# Patient Record
Sex: Male | Born: 1955 | Race: White | Hispanic: No | Marital: Married | Smoking: Never smoker
Health system: Southern US, Community
[De-identification: ages and names within clinical notes are randomized; demographics above are authoritative.]

## PROBLEM LIST (undated history)

## (undated) DIAGNOSIS — IMO0001 Reserved for inherently not codable concepts without codable children: Secondary | ICD-10-CM

## (undated) DIAGNOSIS — I10 Essential (primary) hypertension: Secondary | ICD-10-CM

## (undated) DIAGNOSIS — K219 Gastro-esophageal reflux disease without esophagitis: Secondary | ICD-10-CM

---

## 2011-07-27 ENCOUNTER — Emergency Department (HOSPITAL_BASED_OUTPATIENT_CLINIC_OR_DEPARTMENT_OTHER)
Admission: EM | Admit: 2011-07-27 | Discharge: 2011-07-27 | Disposition: A | Discharge status: Home / Self Care | Payer: 59 | Attending: Emergency Medicine | Admitting: Emergency Medicine

## 2011-07-27 ENCOUNTER — Encounter (HOSPITAL_BASED_OUTPATIENT_CLINIC_OR_DEPARTMENT_OTHER): Payer: Self-pay | Admitting: *Deleted

## 2011-07-27 DIAGNOSIS — I10 Essential (primary) hypertension: Secondary | ICD-10-CM | POA: Insufficient documentation

## 2011-07-27 DIAGNOSIS — S51809A Unspecified open wound of unspecified forearm, initial encounter: Secondary | ICD-10-CM | POA: Insufficient documentation

## 2011-07-27 DIAGNOSIS — IMO0002 Reserved for concepts with insufficient information to code with codable children: Secondary | ICD-10-CM

## 2011-07-27 DIAGNOSIS — Z79899 Other long term (current) drug therapy: Secondary | ICD-10-CM | POA: Insufficient documentation

## 2011-07-27 DIAGNOSIS — M109 Gout, unspecified: Secondary | ICD-10-CM | POA: Insufficient documentation

## 2011-07-27 DIAGNOSIS — K219 Gastro-esophageal reflux disease without esophagitis: Secondary | ICD-10-CM | POA: Insufficient documentation

## 2011-07-27 DIAGNOSIS — W268XXA Contact with other sharp object(s), not elsewhere classified, initial encounter: Secondary | ICD-10-CM | POA: Insufficient documentation

## 2011-07-27 HISTORY — DX: Reserved for inherently not codable concepts without codable children: IMO0001

## 2011-07-27 HISTORY — DX: Gastro-esophageal reflux disease without esophagitis: K21.9

## 2011-07-27 HISTORY — DX: Essential (primary) hypertension: I10

## 2011-07-27 MED ORDER — LIDOCAINE-EPINEPHRINE 2 %-1:100000 IJ SOLN
INTRAMUSCULAR | Status: AC
  Filled 2011-07-27: qty 1

## 2011-07-27 NOTE — ED Provider Notes (Signed)
History    This chart was scribed for Hilario Quarry, MD, MD by Smitty Pluck. The patient was seen in room MH04 and the patient's care was started at 8:43PM.   CSN: [id number]  Arrival date & time 07/27/11  2009   First MD Initiated Contact with Patient 07/27/11 2033      Chief Complaint  Patient presents with  . Foreign Body    (Consider location/radiation/quality/duration/timing/severity/associated sxs/prior treatment) The history is provided by the patient.   Erik Wagner is a 56 y.o. male who presents to the Emergency Department complaining of foreign body (fish hook) in right forearm onset today while fishing. Pt reports tetanus is UTD. Denies any other pain. Pt has hx of gout and HTN.   Past Medical History  Diagnosis Date  . Hypertension   . Reflux   . Gout     History reviewed. No pertinent past surgical history.  History reviewed. No pertinent family history.  History  Substance Use Topics  . Smoking status: Never Smoker   . Smokeless tobacco: Not on file  . Alcohol Use: Yes      Review of Systems  All other systems reviewed and are negative.   10 Systems reviewed and all are negative for acute change except as noted in the HPI.   Allergies  Ivp dye  Home Medications   Current Outpatient Rx  Name Route Sig Dispense Refill  . ALLOPURINOL PO Oral Take 1 tablet by mouth daily.     Marland Kitchen AMLODIPINE BESYLATE PO Oral Take 1 tablet by mouth daily.     . CELECOXIB 50 MG PO CAPS Oral Take 50 mg by mouth daily.     Marland Kitchen PREVACID PO Oral Take 1 tablet by mouth daily.     Marland Kitchen LISINOPRIL 5 MG PO TABS Oral Take 5 mg by mouth daily.      There were no vitals taken for this visit.  Physical Exam  Nursing note and vitals reviewed. Constitutional: He is oriented to person, place, and time. He appears well-developed and well-nourished. No distress.  HENT:  Head: Normocephalic and atraumatic.  Eyes: EOM are normal. Pupils are equal, round, and reactive to light.    Neck: Normal range of motion. Neck supple. No tracheal deviation present.  Cardiovascular: Normal rate.   Pulmonary/Chest: Effort normal. No respiratory distress.  Abdominal: Soft. He exhibits no distension.  Musculoskeletal: Normal range of motion.  Neurological: He is alert and oriented to person, place, and time.  Skin: Skin is warm and dry.  Psychiatric: He has a normal mood and affect. His behavior is normal.    ED Course  FOREIGN BODY REMOVAL Date/Time: 07/27/2011 9:06 PM Performed by: Elson Areas Authorized by: Elson Areas Consent: Verbal consent obtained. Consent given by: patient Patient identity confirmed: verbally with patient Time out: Immediately prior to procedure a "time out" was called to verify the correct patient, procedure, equipment, support staff and site/side marked as required. Anesthesia: local infiltration Local anesthetic: lidocaine 2% with epinephrine Anesthetic total: 8 ml Complexity: simple 1 objects recovered. Objects recovered: fish hook Post-procedure assessment: foreign body removed Patient tolerance: Patient tolerated the procedure well with no immediate complications. Comments: Fishhook,  Pushed through and cut   (including critical care time) DIAGNOSTIC STUDIES:   COORDINATION OF CARE: 8:48PM EDP discusses pt ED treatment with pt    Labs Reviewed - No data to display No results found.   No diagnosis found.    MDM  Lonia Skinner [place], [place] 07/27/11 2108

## 2011-07-27 NOTE — Discharge Instructions (Signed)
Foreign Body  A foreign body is something in your body that should not be there. This may have been caused by a puncture wound or other injury. Puncture wounds become easily infected. This happens when bacteria (germs) get under the skin. Rusty nails and similar foreign bodies are often dirty and carry germs on them.   TREATMENT    A foreign body is usually removed if this can be easily done right after it happens.   Sometimes they are left in and removed at a later surgery. They may be left in indefinitely if they will not cause later problems.   The following are general instructions in caring for your wound.  HOME CARE INSTRUCTIONS    A dressing, depending on the location of the wound, may have been applied. This may be changed once per day or as instructed. If the dressing sticks, it may be soaked off with soapy water or hydrogen peroxide.   Only take over-the-counter or prescription medicines for pain, discomfort, or fever as directed by your caregiver.   Be aware that your body will work to remove the foreign substance. That is, the foreign body may work itself out of the wound. That is normal.   You may have received a recommendation to follow up with your physician or a specialist. It is very important to call for or keep follow-up appointments in order to avoid infection or other complications.  SEEK IMMEDIATE MEDICAL CARE IF:    There is redness, swelling, or increasing pain in the wound.   You notice a foul smell coming from the wound or dressing.   Pus is coming from the wound.   An unexplained oral temperature above 102 F (38.9 C) develops, or as your caregiver suggests.   There is increasing pain in the wound.  If you did not receive a tetanus shot today because you did not recall when your last one was given, check with your caregiver's office and determine if one is needed. Generally for a "dirty" wound, you should receive a tetanus booster if you have not had one in the last five  years. If you have a "clean" wound, you should receive a tetanus booster if you have not had one within the last ten years.  If you have a foreign body that needs removal and this was not done today, make sure you know how you are to follow up and what is the plan of action for taking care of this. It is your responsibility to follow up on this.  MAKE SURE YOU:    Understand these instructions.   Will watch your condition.   Will get help right away if you are not doing well or get worse.  Document Released: 10/02/2000 Document Revised: 03/28/2011 Document Reviewed: 11/26/2007  [employer] Patient Information 2012 [employer].

## 2011-07-27 NOTE — ED Notes (Signed)
Fish hook stuck in right forearm

## 2011-07-30 NOTE — ED Provider Notes (Signed)
  I performed a history and physical examination of Erik Wagner and discussed his management with Langston Masker  I agree with the history, physical, assessment, and plan of care, with the following exceptions: None  I was present for the following procedures: None Time Spent in Critical Care of the patient: None   Annisa Mazzarella Corlis Leak, MD 07/30/11 1734

## 2011-12-21 ENCOUNTER — Emergency Department (HOSPITAL_BASED_OUTPATIENT_CLINIC_OR_DEPARTMENT_OTHER)
Admission: EM | Admit: 2011-12-21 | Discharge: 2011-12-21 | Disposition: A | Discharge status: Home / Self Care | Payer: 59 | Attending: Emergency Medicine | Admitting: Emergency Medicine

## 2011-12-21 ENCOUNTER — Encounter (HOSPITAL_BASED_OUTPATIENT_CLINIC_OR_DEPARTMENT_OTHER): Payer: Self-pay | Admitting: *Deleted

## 2011-12-21 ENCOUNTER — Emergency Department (HOSPITAL_BASED_OUTPATIENT_CLINIC_OR_DEPARTMENT_OTHER): Payer: 59

## 2011-12-21 DIAGNOSIS — K219 Gastro-esophageal reflux disease without esophagitis: Secondary | ICD-10-CM | POA: Insufficient documentation

## 2011-12-21 DIAGNOSIS — I10 Essential (primary) hypertension: Secondary | ICD-10-CM | POA: Insufficient documentation

## 2011-12-21 DIAGNOSIS — X500XXA Overexertion from strenuous movement or load, initial encounter: Secondary | ICD-10-CM | POA: Insufficient documentation

## 2011-12-21 DIAGNOSIS — M109 Gout, unspecified: Secondary | ICD-10-CM | POA: Insufficient documentation

## 2011-12-21 DIAGNOSIS — S46219A Strain of muscle, fascia and tendon of other parts of biceps, unspecified arm, initial encounter: Secondary | ICD-10-CM

## 2011-12-21 DIAGNOSIS — S43499A Other sprain of unspecified shoulder joint, initial encounter: Secondary | ICD-10-CM | POA: Insufficient documentation

## 2011-12-21 MED ORDER — HYDROCODONE-ACETAMINOPHEN 5-500 MG PO TABS: 1.0000 | ORAL_TABLET | Freq: Four times a day (QID) | ORAL | Status: AC | PRN

## 2011-12-21 NOTE — ED Notes (Signed)
Discharged with one prescription 

## 2011-12-21 NOTE — ED Notes (Addendum)
Pt states he was lifting an object and felt pop in his left elbow. +radial pulse. Moves fingers. Feels touch, but cannot bend arm. Ice applied.

## 2011-12-22 NOTE — ED Provider Notes (Signed)
History     CSN: [id number]  Arrival date & time 12/21/11  1639   First MD Initiated Contact with Patient 12/21/11 1747      Chief Complaint  Patient presents with  . Elbow Injury    (Consider location/radiation/quality/duration/timing/severity/associated sxs/prior treatment) HPI Patient is a 56 yo male who presents today with acute onset of left elbow pain that began today when he was lowering a large piece of machinery off a trailer and felt a sudden popin his left antecubital fossa.  Subsequently patient has been unable to flex at the elbow and has obvious deformity in the curvature of his biceps.  Patient denies pain at rest and reports 3/10 pain with movement. He is on celebrex at baseline and has not taken anything for pain.  There are no other associated injuries. There are no other associated or modifying factors.  Past Medical History  Diagnosis Date  . Hypertension   . Reflux   . Gout     History reviewed. No pertinent past surgical history.  History reviewed. No pertinent family history.  History  Substance Use Topics  . Smoking status: Never Smoker   . Smokeless tobacco: Not on file  . Alcohol Use: Yes      Review of Systems  Constitutional: Negative.   HENT: Negative.   Eyes: Negative.   Respiratory: Negative.   Cardiovascular: Negative.   Gastrointestinal: Negative.   Genitourinary: Negative.   Musculoskeletal:       Left elbow pain  Skin: Negative.   Neurological: Negative.   Hematological: Negative.   Psychiatric/Behavioral: Negative.   All other systems reviewed and are negative.    Allergies  Ivp dye  Home Medications   Current Outpatient Rx  Name Route Sig Dispense Refill  . ALLOPURINOL 300 MG PO TABS Oral Take 300 mg by mouth daily.    Marland Kitchen AMLODIPINE BESYLATE 5 MG PO TABS Oral Take 5 mg by mouth daily.    . CELECOXIB 50 MG PO CAPS Oral Take 50 mg by mouth daily.     Marland Kitchen PREVACID PO Oral Take 1 tablet by mouth daily.     Marland Kitchen LISINOPRIL 5  MG PO TABS Oral Take 5 mg by mouth daily.    Marland Kitchen HYDROCODONE-ACETAMINOPHEN 5-500 MG PO TABS Oral Take 1-2 tablets by mouth every 6 (six) hours as needed for pain. 15 tablet 0    BP 130/80  Pulse 65  Temp 97.9 F (36.6 C) (Oral)  Resp 16  Ht 6' (1.829 m)  Wt 220 lb (99.791 kg)  BMI 29.84 kg/m2  SpO2 100%  Physical Exam  Nursing note and vitals reviewed. GEN: Well-developed, well-nourished male in no distress HEENT: Atraumatic, normocephalic.  EYES: PERRLA BL, no scleral icterus. NECK: Trachea midline, no meningismus PULM: No respiratory distress.   Neuro: cranial nerves grossly 2-12 intact, A and O x 3 WUJ:WJXB upper extremity with mild TTP just proximal to the [facility] fossa. Obvious deformity of left biceps with no ability to flex more than 15 degrees at the left elbow.  Otherwise WNL. Skin: No rashes petechiae, purpura, or jaundice Psych: no abnormality of mood   ED Course  Procedures (including critical care time)  Labs Reviewed - No data to display Dg Elbow Complete Left  12/21/2011  *RADIOLOGY REPORT*  Clinical Data: Left elbow injury and pain.  LEFT ELBOW - COMPLETE 3+ VIEW  Comparison: None.  Findings: No acute fracture or dislocation identified.  There is no evidence of joint effusion.  Some degenerative  changes are identified primarily at the level of the lateral epicondyle.  IMPRESSION: No acute findings.   Original Report Authenticated By: Reola Calkins, M.D.      1. Biceps tendon rupture, traumatic       MDM  Plain film showed no abnormalities and patient had presentation consistent with biceps tendon rupture.  He was given ice and a sling.  Patient already on celebrex.  Given Rx for narcotic pain relief in case needed but patient did not feel he needed this at this time.  Provided with ortho follow-up and discharged in good condition.        Cyndra Numbers, MD 12/22/11 1049

## 2013-08-24 IMAGING — CR DG ELBOW COMPLETE 3+V*L*
4 series · 4 of 4 positions shown · non-contrast
Comparison: None.

CLINICAL DATA: Left elbow injury and pain.

LEFT ELBOW - COMPLETE 3+ VIEW

[x elbow joint lat left]
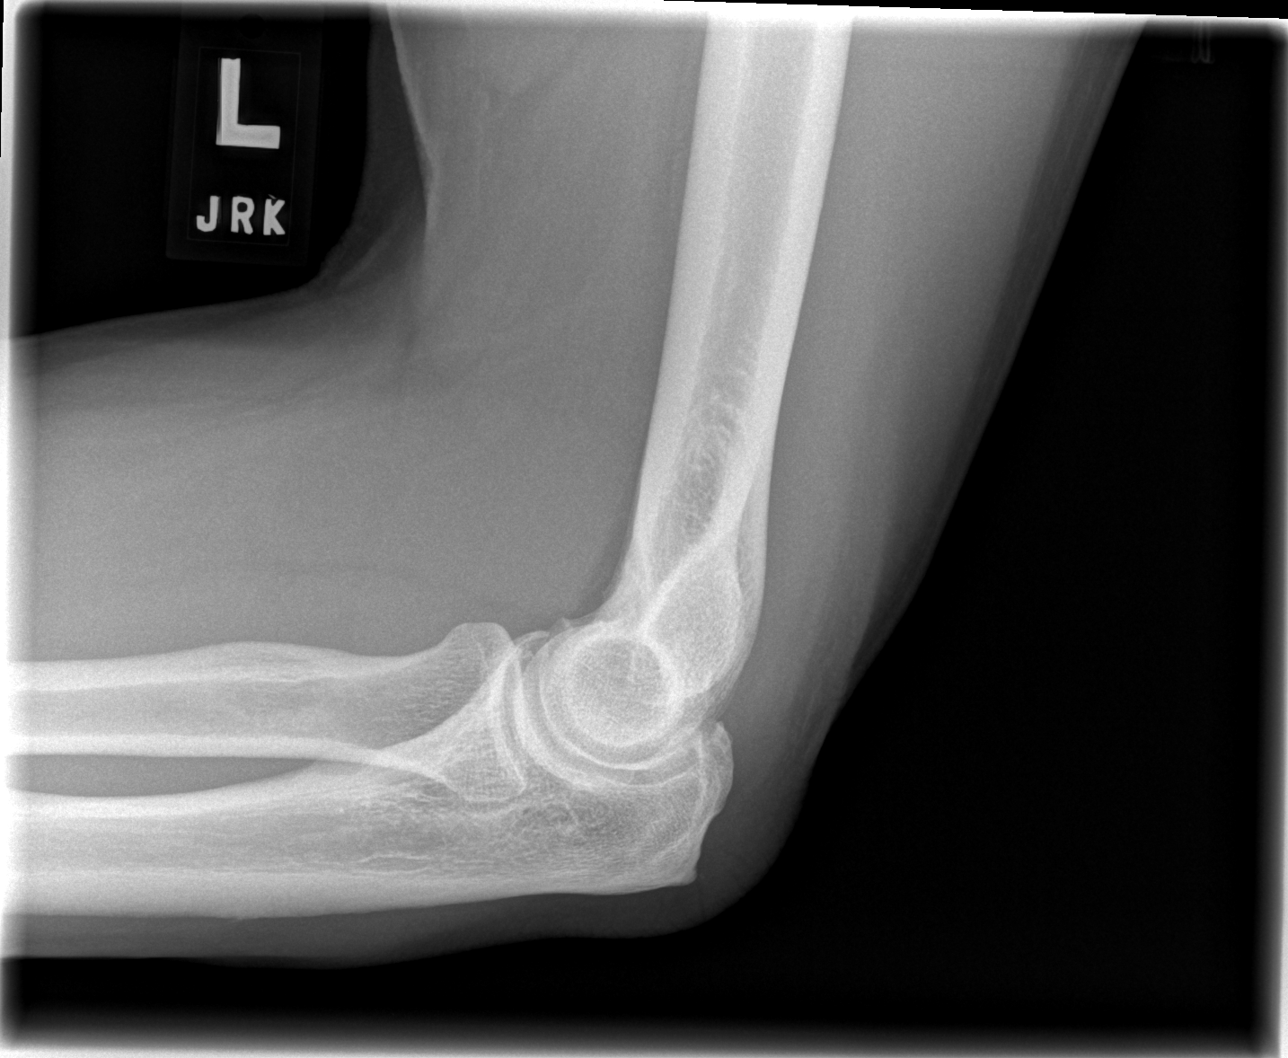

[x elbow joint ap left]
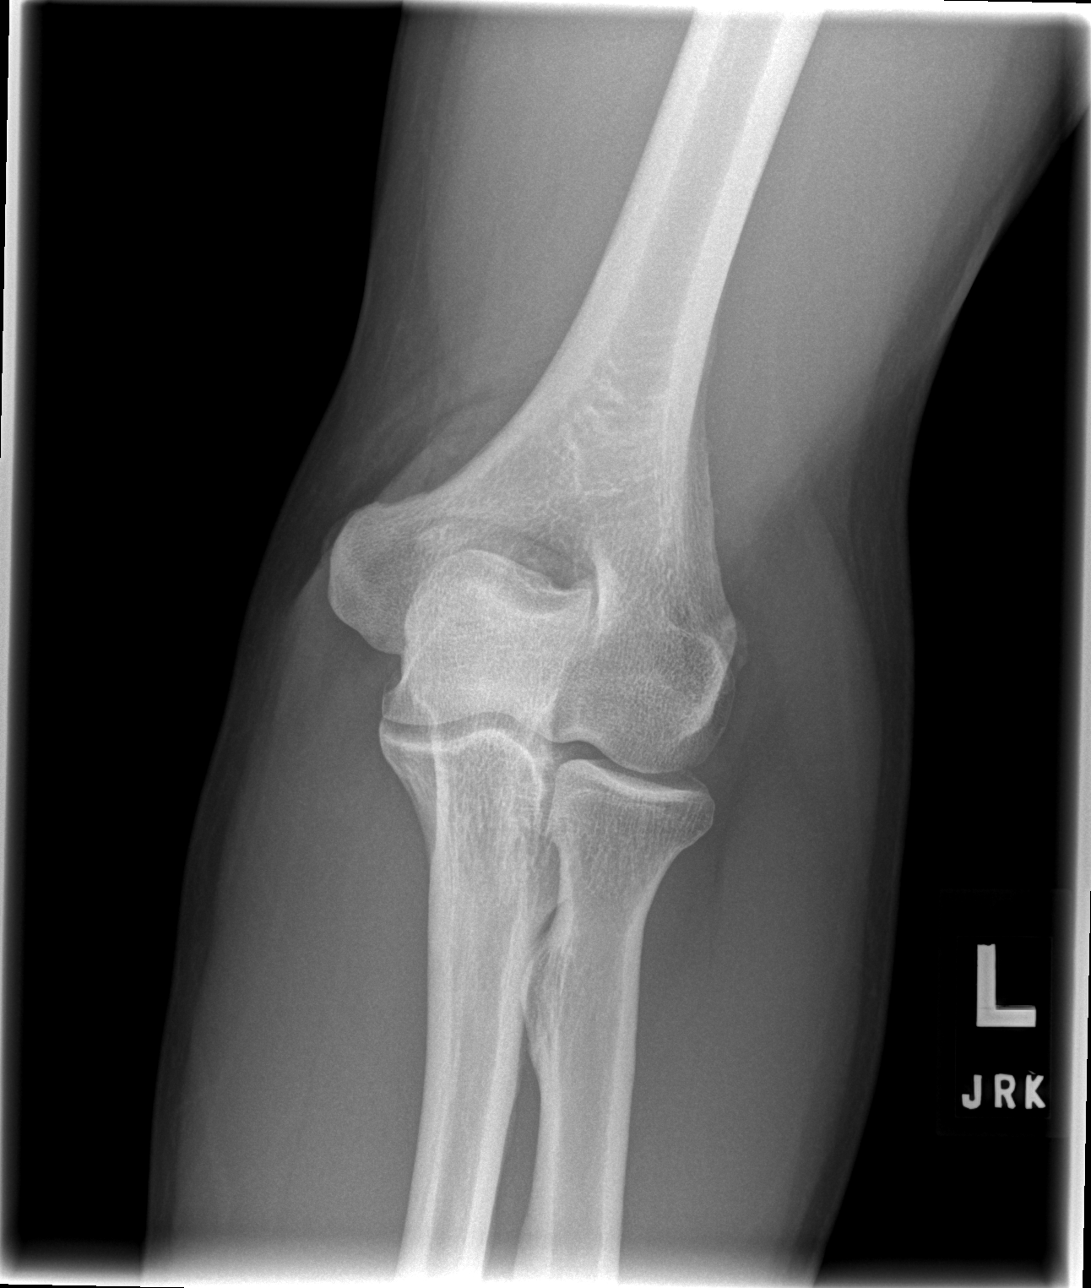

[x elbow joint obl. left (1 of 2)]
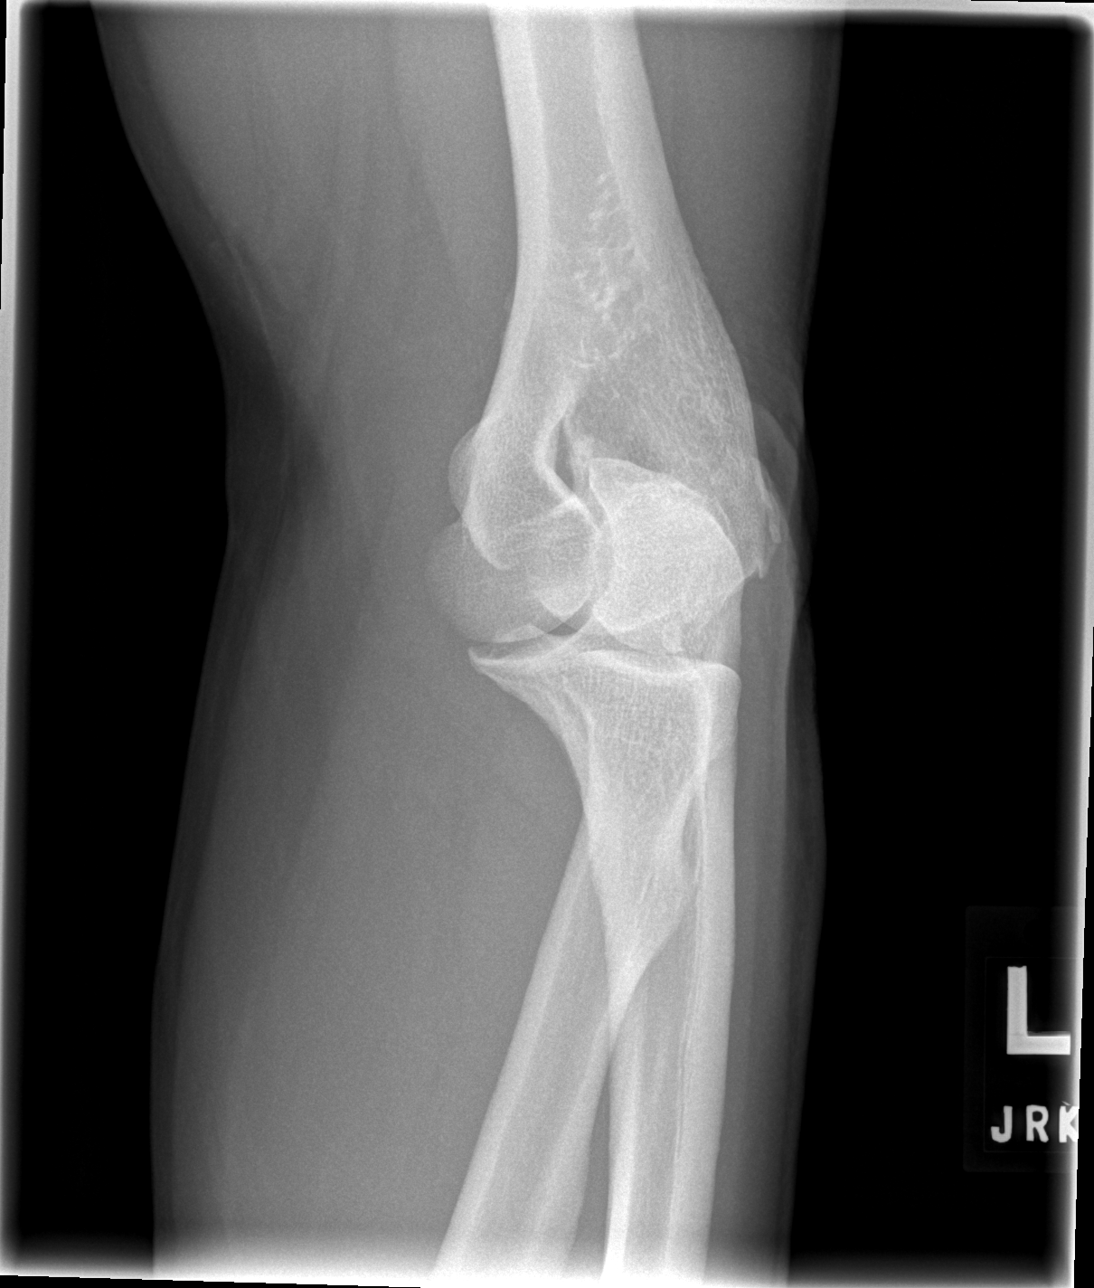

[x elbow joint obl. left (2 of 2)]
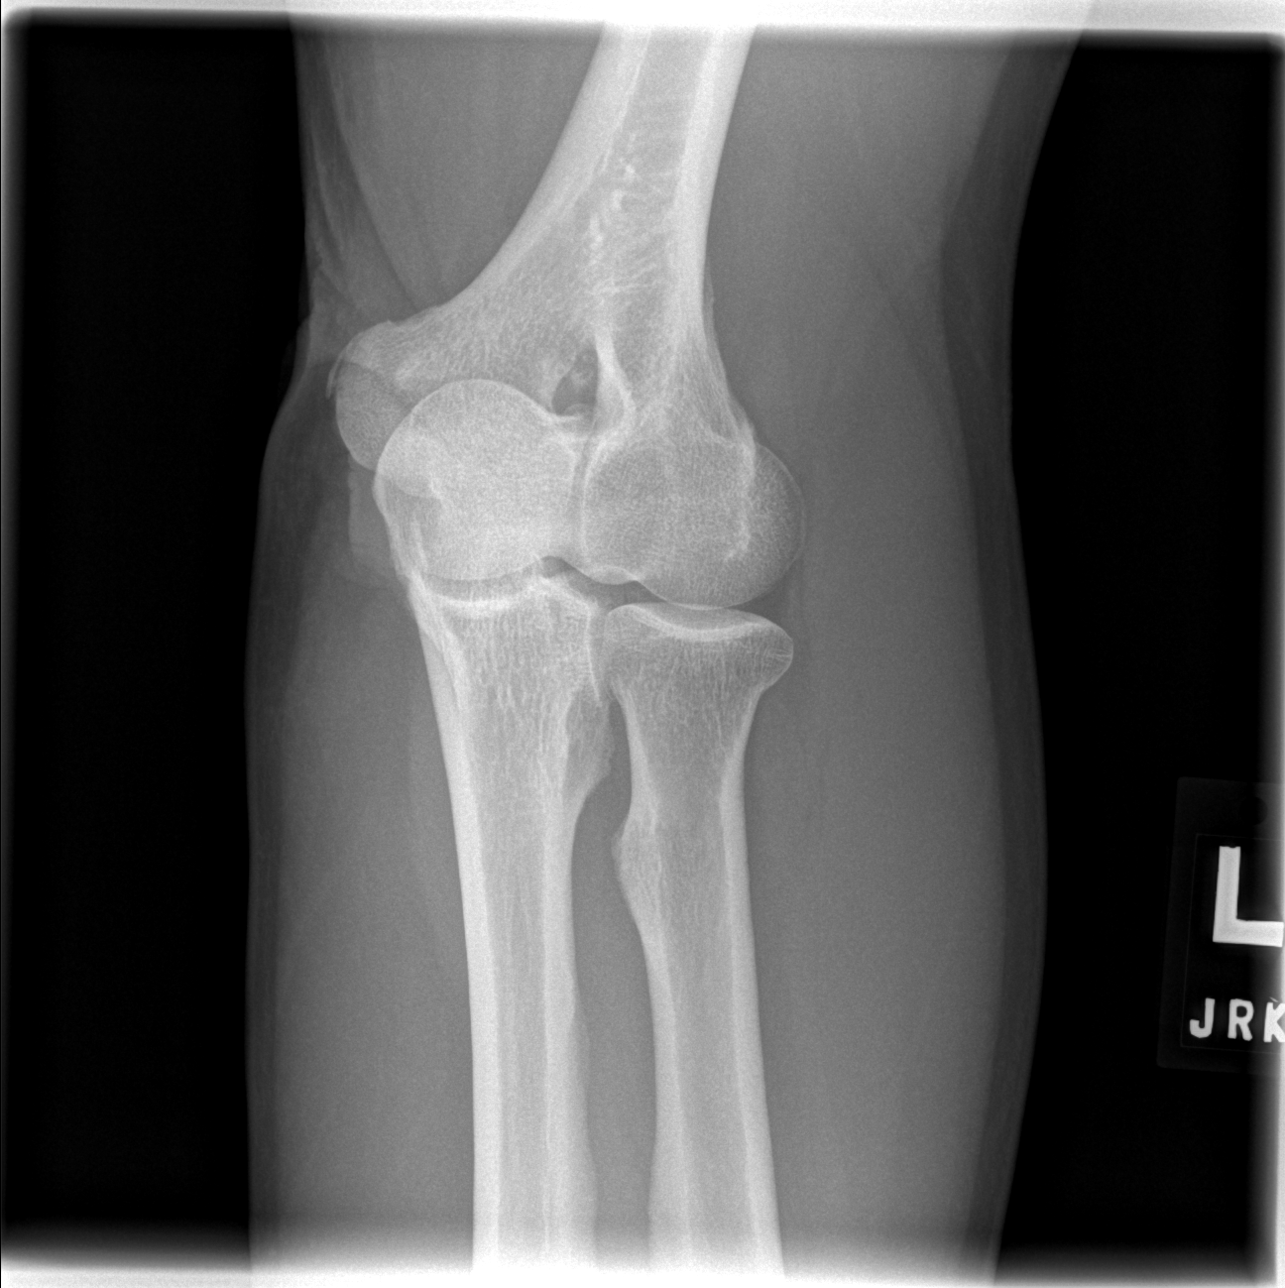

[4 of 4 positions shown; findings below may reference images not displayed]

FINDINGS: No acute fracture or dislocation identified.  There is no
evidence of joint effusion.  Some degenerative changes are
identified primarily at the level of the lateral epicondyle.
IMPRESSION: No acute findings.

## 2014-11-08 ENCOUNTER — Encounter (HOSPITAL_BASED_OUTPATIENT_CLINIC_OR_DEPARTMENT_OTHER): Payer: Self-pay

## 2014-11-08 ENCOUNTER — Emergency Department (HOSPITAL_BASED_OUTPATIENT_CLINIC_OR_DEPARTMENT_OTHER)
Admission: EM | Admit: 2014-11-08 | Discharge: 2014-11-08 | Disposition: A | Discharge status: Home / Self Care | Payer: BLUE CROSS/BLUE SHIELD | Attending: Emergency Medicine | Admitting: Emergency Medicine

## 2014-11-08 ENCOUNTER — Emergency Department (HOSPITAL_BASED_OUTPATIENT_CLINIC_OR_DEPARTMENT_OTHER): Payer: BLUE CROSS/BLUE SHIELD

## 2014-11-08 DIAGNOSIS — S91111A Laceration without foreign body of right great toe without damage to nail, initial encounter: Secondary | ICD-10-CM | POA: Insufficient documentation

## 2014-11-08 DIAGNOSIS — Z79899 Other long term (current) drug therapy: Secondary | ICD-10-CM | POA: Diagnosis not present

## 2014-11-08 DIAGNOSIS — S92424B Nondisplaced fracture of distal phalanx of right great toe, initial encounter for open fracture: Secondary | ICD-10-CM | POA: Insufficient documentation

## 2014-11-08 DIAGNOSIS — I10 Essential (primary) hypertension: Secondary | ICD-10-CM | POA: Insufficient documentation

## 2014-11-08 DIAGNOSIS — W228XXA Striking against or struck by other objects, initial encounter: Secondary | ICD-10-CM | POA: Diagnosis not present

## 2014-11-08 DIAGNOSIS — S92404B Nondisplaced unspecified fracture of right great toe, initial encounter for open fracture: Secondary | ICD-10-CM

## 2014-11-08 DIAGNOSIS — M109 Gout, unspecified: Secondary | ICD-10-CM | POA: Diagnosis not present

## 2014-11-08 DIAGNOSIS — Y9289 Other specified places as the place of occurrence of the external cause: Secondary | ICD-10-CM | POA: Diagnosis not present

## 2014-11-08 DIAGNOSIS — K219 Gastro-esophageal reflux disease without esophagitis: Secondary | ICD-10-CM | POA: Insufficient documentation

## 2014-11-08 DIAGNOSIS — Y9301 Activity, walking, marching and hiking: Secondary | ICD-10-CM | POA: Diagnosis not present

## 2014-11-08 DIAGNOSIS — Z791 Long term (current) use of non-steroidal anti-inflammatories (NSAID): Secondary | ICD-10-CM | POA: Insufficient documentation

## 2014-11-08 DIAGNOSIS — R42 Dizziness and giddiness: Secondary | ICD-10-CM | POA: Diagnosis not present

## 2014-11-08 DIAGNOSIS — Y998 Other external cause status: Secondary | ICD-10-CM | POA: Insufficient documentation

## 2014-11-08 DIAGNOSIS — S99921A Unspecified injury of right foot, initial encounter: Secondary | ICD-10-CM | POA: Diagnosis present

## 2014-11-08 LAB — CBC
HCT: 42.4 % (ref 39.0–52.0)
Hemoglobin: 14.6 g/dL (ref 13.0–17.0)
MCH: 32.2 pg (ref 26.0–34.0)
MCHC: 34.4 g/dL (ref 30.0–36.0)
MCV: 93.6 fL (ref 78.0–100.0)
Platelets: 186 10*3/uL (ref 150–400)
RBC: 4.53 MIL/uL (ref 4.22–5.81)
RDW: 12.9 % (ref 11.5–15.5)
WBC: 6.9 10*3/uL (ref 4.0–10.5)

## 2014-11-08 LAB — BASIC METABOLIC PANEL
ANION GAP: 6 (ref 5–15)
BUN: 21 mg/dL — AB (ref 6–20)
CHLORIDE: 105 mmol/L (ref 101–111)
CO2: 26 mmol/L (ref 22–32)
CREATININE: 1.14 mg/dL (ref 0.61–1.24)
Calcium: 8.6 mg/dL — ABNORMAL LOW (ref 8.9–10.3)
GFR calc Af Amer: >60 mL/min (ref >60)
Glucose, Bld: 92 mg/dL (ref 65–99)
Potassium: 3.8 mmol/L (ref 3.5–5.1)
Sodium: 137 mmol/L (ref 135–145)

## 2014-11-08 MED ORDER — CEFAZOLIN SODIUM 1-5 GM-% IV SOLN
INTRAVENOUS | Status: AC
  Administered 2014-11-08: 1000 mg
  Filled 2014-11-08: qty 100

## 2014-11-08 MED ORDER — DEXTROSE 5 % IV SOLN: 2.0000 g | Freq: Once | INTRAVENOUS | Status: DC

## 2014-11-08 MED ORDER — CEPHALEXIN 500 MG PO CAPS: 500.0000 mg | ORAL_CAPSULE | Freq: Four times a day (QID) | ORAL | Status: AC

## 2014-11-08 MED ORDER — SODIUM CHLORIDE 0.9 % IV BOLUS (SEPSIS)
1000.0000 mL | Freq: Once | INTRAVENOUS | Status: AC
  Administered 2014-11-08: 1000 mL via INTRAVENOUS

## 2014-11-08 MED ORDER — LIDOCAINE HCL (PF) 1 % IJ SOLN
INTRAMUSCULAR | Status: AC
  Administered 2014-11-08: 10 mL
  Filled 2014-11-08: qty 5

## 2014-11-08 MED ORDER — MORPHINE SULFATE 4 MG/ML IJ SOLN
4.0000 mg | Freq: Once | INTRAMUSCULAR | Status: AC
  Administered 2014-11-08: 4 mg via INTRAVENOUS
  Filled 2014-11-08: qty 1

## 2014-11-08 MED ORDER — OXYCODONE-ACETAMINOPHEN 5-325 MG PO TABS: 1.0000 | ORAL_TABLET | Freq: Four times a day (QID) | ORAL | Status: AC | PRN

## 2014-11-08 MED ORDER — HYDROMORPHONE HCL 1 MG/ML IJ SOLN
1.0000 mg | Freq: Once | INTRAMUSCULAR | Status: AC
  Administered 2014-11-08: 1 mg via INTRAVENOUS
  Filled 2014-11-08: qty 1

## 2014-11-08 MED ORDER — LIDOCAINE HCL (PF) 1 % IJ SOLN
10.0000 mL | Freq: Once | INTRAMUSCULAR | Status: AC
  Administered 2014-11-08: 10 mL
  Filled 2014-11-08: qty 10

## 2014-11-08 MED ORDER — CEFAZOLIN SODIUM 1-5 GM-% IV SOLN
1.0000 g | Freq: Once | INTRAVENOUS | Status: AC
  Administered 2014-11-08: 1 g via INTRAVENOUS

## 2014-11-08 NOTE — Discharge Instructions (Signed)
Take percocet for severe pain only. No driving or operating heavy machinery while taking percocet. This medication may cause drowsiness. Take keflex as directed.  Toe Fracture Your caregiver has diagnosed you as having a fractured toe. A toe fracture is a break in the bone of a toe. "Buddy taping" is a way of splinting your broken toe, by taping the broken toe to the toe next to it. This "buddy taping" will keep the injured toe from moving beyond normal range of motion. Buddy taping also helps the toe heal in a more normal alignment. It may take 6 to 8 weeks for the toe injury to heal. HOME CARE INSTRUCTIONS   Leave your toes taped together for as long as directed by your caregiver or until you see a doctor for a follow-up examination. You can change the tape after bathing. Always use a small piece of gauze or cotton between the toes when taping them together. This will help the skin stay dry and prevent infection.  Apply ice to the injury for 15-20 minutes each hour while awake for the first 2 days. Put the ice in a plastic bag and place a towel between the bag of ice and your skin.  After the first 2 days, apply heat to the injured area. Use heat for the next 2 to 3 days. Place a heating pad on the foot or soak the foot in warm water as directed by your caregiver.  Keep your foot elevated as much as possible to lessen swelling.  Wear sturdy, supportive shoes. The shoes should not pinch the toes or fit tightly against the toes.  Your caregiver may prescribe a rigid shoe if your foot is very swollen.  Your may be given crutches if the pain is too great and it hurts too much to walk.  Only take over-the-counter or prescription medicines for pain, discomfort, or fever as directed by your caregiver.  If your caregiver has given you a follow-up appointment, it is very important to keep that appointment. Not keeping the appointment could result in a chronic or permanent injury, pain, and  disability. If there is any problem keeping the appointment, you must call back to this facility for assistance. SEEK MEDICAL CARE IF:   You have increased pain or swelling, not relieved with medications.  The pain does not get better after 1 week.  Your injured toe is cold when the others are warm. SEEK IMMEDIATE MEDICAL CARE IF:   The toe becomes cold, numb, or white.  The toe becomes hot (inflamed) and red. Document Released: 04/05/2000 Document Revised: 07/01/2011 Document Reviewed: 11/23/2007 [facility] Patient Information 2015 [place], [place]. This information is not intended to replace advice given to you by your health care provider. Make sure you discuss any questions you have with your health care provider. Laceration Care, Adult A laceration is a cut or lesion that goes through all layers of the skin and into the tissue just beneath the skin. TREATMENT  Some lacerations may not require closure. Some lacerations may not be able to be closed due to an increased risk of infection. It is important to see your caregiver as soon as possible after an injury to minimize the risk of infection and maximize the opportunity for successful closure. If closure is appropriate, pain medicines may be given, if needed. The wound will be cleaned to help prevent infection. Your caregiver will use stitches (sutures), staples, wound glue (adhesive), or skin adhesive strips to repair the laceration. These tools bring  the skin edges together to allow for faster healing and a better cosmetic outcome. However, all wounds will heal with a scar. Once the wound has healed, scarring can be minimized by covering the wound with sunscreen during the day for 1 full year. HOME CARE INSTRUCTIONS  For sutures or staples:  Keep the wound clean and dry.  If you were given a bandage (dressing), you should change it at least once a day. Also, change the dressing if it becomes wet or dirty, or as directed by your  caregiver.  Wash the wound with soap and water 2 times a day. Rinse the wound off with water to remove all soap. Pat the wound dry with a clean towel.  After cleaning, apply a thin layer of the antibiotic ointment as recommended by your caregiver. This will help prevent infection and keep the dressing from sticking.  You may shower as usual after the first 24 hours. Do not soak the wound in water until the sutures are removed.  Only take over-the-counter or prescription medicines for pain, discomfort, or fever as directed by your caregiver.  Get your sutures or staples removed as directed by your caregiver. For skin adhesive strips:  Keep the wound clean and dry.  Do not get the skin adhesive strips wet. You may bathe carefully, using caution to keep the wound dry.  If the wound gets wet, pat it dry with a clean towel.  Skin adhesive strips will fall off on their own. You may trim the strips as the wound heals. Do not remove skin adhesive strips that are still stuck to the wound. They will fall off in time. For wound adhesive:  You may briefly wet your wound in the shower or bath. Do not soak or scrub the wound. Do not swim. Avoid periods of heavy perspiration until the skin adhesive has fallen off on its own. After showering or bathing, gently pat the wound dry with a clean towel.  Do not apply liquid medicine, cream medicine, or ointment medicine to your wound while the skin adhesive is in place. This may loosen the film before your wound is healed.  If a dressing is placed over the wound, be careful not to apply tape directly over the skin adhesive. This may cause the adhesive to be pulled off before the wound is healed.  Avoid prolonged exposure to sunlight or tanning lamps while the skin adhesive is in place. Exposure to ultraviolet light in the first year will darken the scar.  The skin adhesive will usually remain in place for 5 to 10 days, then naturally fall off the skin. Do  not pick at the adhesive film. You may need a tetanus shot if:  You cannot remember when you had your last tetanus shot.  You have never had a tetanus shot. If you get a tetanus shot, your arm may swell, get red, and feel warm to the touch. This is common and not a problem. If you need a tetanus shot and you choose not to have one, there is a rare chance of getting tetanus. Sickness from tetanus can be serious. SEEK MEDICAL CARE IF:   You have redness, swelling, or increasing pain in the wound.  You see a red line that goes away from the wound.  You have yellowish-white fluid (pus) coming from the wound.  You have a fever.  You notice a bad smell coming from the wound or dressing.  Your wound breaks open before or after sutures  have been removed.  You notice something coming out of the wound such as wood or glass.  Your wound is on your hand or foot and you cannot move a finger or toe. SEEK IMMEDIATE MEDICAL CARE IF:   Your pain is not controlled with prescribed medicine.  You have severe swelling around the wound causing pain and numbness or a change in color in your arm, hand, leg, or foot.  Your wound splits open and starts bleeding.  You have worsening numbness, weakness, or loss of function of any joint around or beyond the wound.  You develop painful lumps near the wound or on the skin anywhere on your body. MAKE SURE YOU:   Understand these instructions.  Will watch your condition.  Will get help right away if you are not doing well or get worse. Document Released: 04/08/2005 Document Revised: 07/01/2011 Document Reviewed: 10/02/2010 [facility] Patient Information 2015 [place]. This information is not intended to replace advice given to you by your health care provider. Make sure you discuss any questions you have with your health care provider.

## 2014-11-08 NOTE — ED Notes (Signed)
Pt states his td is utd.

## 2014-11-08 NOTE — ED Notes (Signed)
Patient transported to X-ray 

## 2014-11-08 NOTE — ED Notes (Signed)
Iv d/c tip intact, dsd applied. Iv started earlier by misty, rn.

## 2014-11-08 NOTE — ED Provider Notes (Signed)
CSN: [id number]     Arrival date & time 11/08/14  1558 History   First MD Initiated Contact with Patient 11/08/14 1600     Chief Complaint  Patient presents with  . Foot Injury     (Consider location/radiation/quality/duration/timing/severity/associated sxs/prior Treatment) HPI Comments: 59 year old male presenting with right great toe pain and laceration occurring just prior to arrival. Patient states she was walking up the steps in flip flops and his toe got stuck, and when he tried to move, his "big toe did not go with him". He felt something tear and immediately had severe pain rated 7/10. He cannot move his toe. States there was a large amount of bleeding and is starting to feel lightheaded. Last tetanus 2012.  Patient is a 59 y.o. male presenting with foot injury. The history is provided by the patient.  Foot Injury   Past Medical History  Diagnosis Date  . Hypertension   . Reflux   . Gout    History reviewed. No pertinent past surgical history. No family history on file. History  Substance Use Topics  . Smoking status: Never Smoker   . Smokeless tobacco: Not on file  . Alcohol Use: Yes    Review of Systems  Skin: Positive for wound.  Neurological: Positive for light-headedness.  All other systems reviewed and are negative.     Allergies  Ivp dye  Home Medications   Prior to Admission medications   Medication Sig Start Date End Date Taking? Authorizing Provider  allopurinol (ZYLOPRIM) 300 MG tablet Take 300 mg by mouth daily.    Historical Provider, MD  amLODipine (NORVASC) 5 MG tablet Take 5 mg by mouth daily.    Historical Provider, MD  celecoxib (CELEBREX) 50 MG capsule Take 50 mg by mouth daily.     Historical Provider, MD  cephALEXin (KEFLEX) 500 MG capsule Take 1 capsule (500 mg total) by mouth 4 (four) times daily. 11/08/14   Affan Callow M Markise Haymer, PA-C  Lansoprazole (PREVACID PO) Take 1 tablet by mouth daily.     Historical Provider, MD  lisinopril  (PRINIVIL,ZESTRIL) 5 MG tablet Take 5 mg by mouth daily.    Historical Provider, MD  oxyCODONE-acetaminophen (PERCOCET) 5-325 MG per tablet Take 1-2 tablets by mouth every 6 (six) hours as needed for severe pain. 11/08/14   Desi Rowe M Arieona Swaggerty, PA-C   BP 142/86 mmHg  Pulse 69  Temp(Src) 98 F (36.7 C) (Oral)  Resp 16  Ht  (1.905 m)  Wt 225 lb (102.059 kg)  BMI 28.12 kg/m2  SpO2 98% Physical Exam  Constitutional: He is oriented to person, place, and time. He appears well-developed and well-nourished. No distress.  HENT:  Head: Normocephalic and atraumatic.  Eyes: Conjunctivae and EOM are normal.  Neck: Normal range of motion. Neck supple.  Cardiovascular: Normal rate, regular rhythm and normal heart sounds.   Pulmonary/Chest: Effort normal and breath sounds normal.  Musculoskeletal:  R big toe with large laceration through underlying tissue across dorsal aspect. Unable to flex his toe. Actively bleeding. See image below.  Neurological: He is alert and oriented to person, place, and time.  Skin: Skin is warm and dry.  Psychiatric: He has a normal mood and affect. His behavior is normal.  Nursing note and vitals reviewed.     ED Course  NERVE BLOCK Date/Time: 11/08/2014 6:18 PM Performed by: Kathrynn Speed Authorized by: Kathrynn Speed Consent: Verbal consent obtained. Risks and benefits: risks, benefits and alternatives were discussed Consent given by:  patient Patient understanding: patient states understanding of the procedure being performed Patient identity confirmed: arm band and verbally with patient Indications: pain relief, extensive wound and fracture Body area: lower extremity Nerve: digital Laterality: right Patient sedated: no Needle gauge: 25 G Location technique: anatomical landmarks Local anesthetic: lidocaine 1% without epinephrine Anesthetic total: 3 ml Outcome: pain improved Patient tolerance: Patient tolerated the procedure well with no immediate  complications   (including critical care time) LACERATION REPAIR Performed by: Celene Skeenobyn Merril Nagy Authorized by: Celene Skeenobyn Eliya Bubar Consent: Verbal consent obtained. Risks and benefits: risks, benefits and alternatives were discussed Consent given by: patient Patient identity confirmed: provided demographic data Prepped and Draped in normal sterile fashion Wound explored  Laceration Location: right big toe  Laceration Length: 3 cm  No Foreign Bodies seen or palpated  Anesthesia: local infiltration, digital block  Local anesthetic: lidocaine 1% without epinephrine  Anesthetic total: 5 ml  Irrigation method: syringe Amount of cleaning: standard  Skin closure: 4-0 chromic gut, 4-0 prolene  Number of sutures: 2 chromic gut subQ, 12 prolene  Technique: simple interrupted  Patient tolerance: Patient tolerated the procedure well with no immediate complications.  Labs Review Labs Reviewed  BASIC METABOLIC PANEL - Abnormal; Notable for the following:    BUN 21 (*)    Calcium 8.6 (*)    All other components within normal limits  CBC    Imaging Review Dg Foot Complete Right  11/08/2014   CLINICAL DATA:  Caught foot wall walking up steps to the deck, laceration great toe  EXAM: RIGHT FOOT COMPLETE - 3+ VIEW  COMPARISON:  None  FINDINGS: Dressing artifacts at great toe.  Osseous mineralization normal.  Joint spaces preserved.  Comminuted nondisplaced fracture distal phalanx RIGHT great toe with intra-articular extension at the lateral aspect of the IP joint.  No additional fracture, dislocation or bone destruction.  Calcaneal spurring.  IMPRESSION: Comminuted nondisplaced fracture at distal phalanx great toe with intra-articular extension at the IP joint.   Electronically Signed   By: Ulyses SouthwardMark  Boles M.D.   On: 11/08/2014 16:58     EKG Interpretation None      MDM   Final diagnoses:  Open nondisplaced fracture of right great toe, initial encounter   Wound care given. Likely has tendon  injury given he cannot flex his toe. Laceration caused by his toe extending back, did not get cut on anything. Xray results as stated above. I spoke with Dr. Despina HickAlusio, orthopedist on call who suggests suturing the wound in the ED and having him f/u in 2 days in his office. Dr. Despina HickAlusio looked at both the xray and the picture in chart. Dose of IV ancef given in ED. D/c home on keflex and with percocet. Post op shoe applied, crutches given. Stable for d/c. Return precautions given. Patient states understanding of treatment care plan and is agreeable.  Discussed with attending Dr. Rubin PayorPickering who also evaluated patient and agrees with plan of care.  Kathrynn SpeedRobyn M Albirda Shiel, PA-C 11/08/14 Rickey [id number]  Benjiman CoreNathan Pickering, MD 11/08/14 [phone]

## 2014-11-08 NOTE — ED Notes (Signed)
Active bleeding noted upon removal of dressing for wound cleansing, pressure dressing applied. Pt tolerated well.

## 2014-11-08 NOTE — ED Notes (Signed)
Pts foot got caught while walking up steps. Laceration to right foot. Wrapped in triage

## 2016-07-12 IMAGING — CR DG FOOT COMPLETE 3+V*R*
3 series · 3 of 3 positions shown · non-contrast
Comparison: None

CLINICAL DATA: Caught foot wall walking up steps to the deck,
laceration great toe

EXAM:
RIGHT FOOT COMPLETE - 3+ VIEW

[t foot lat right]
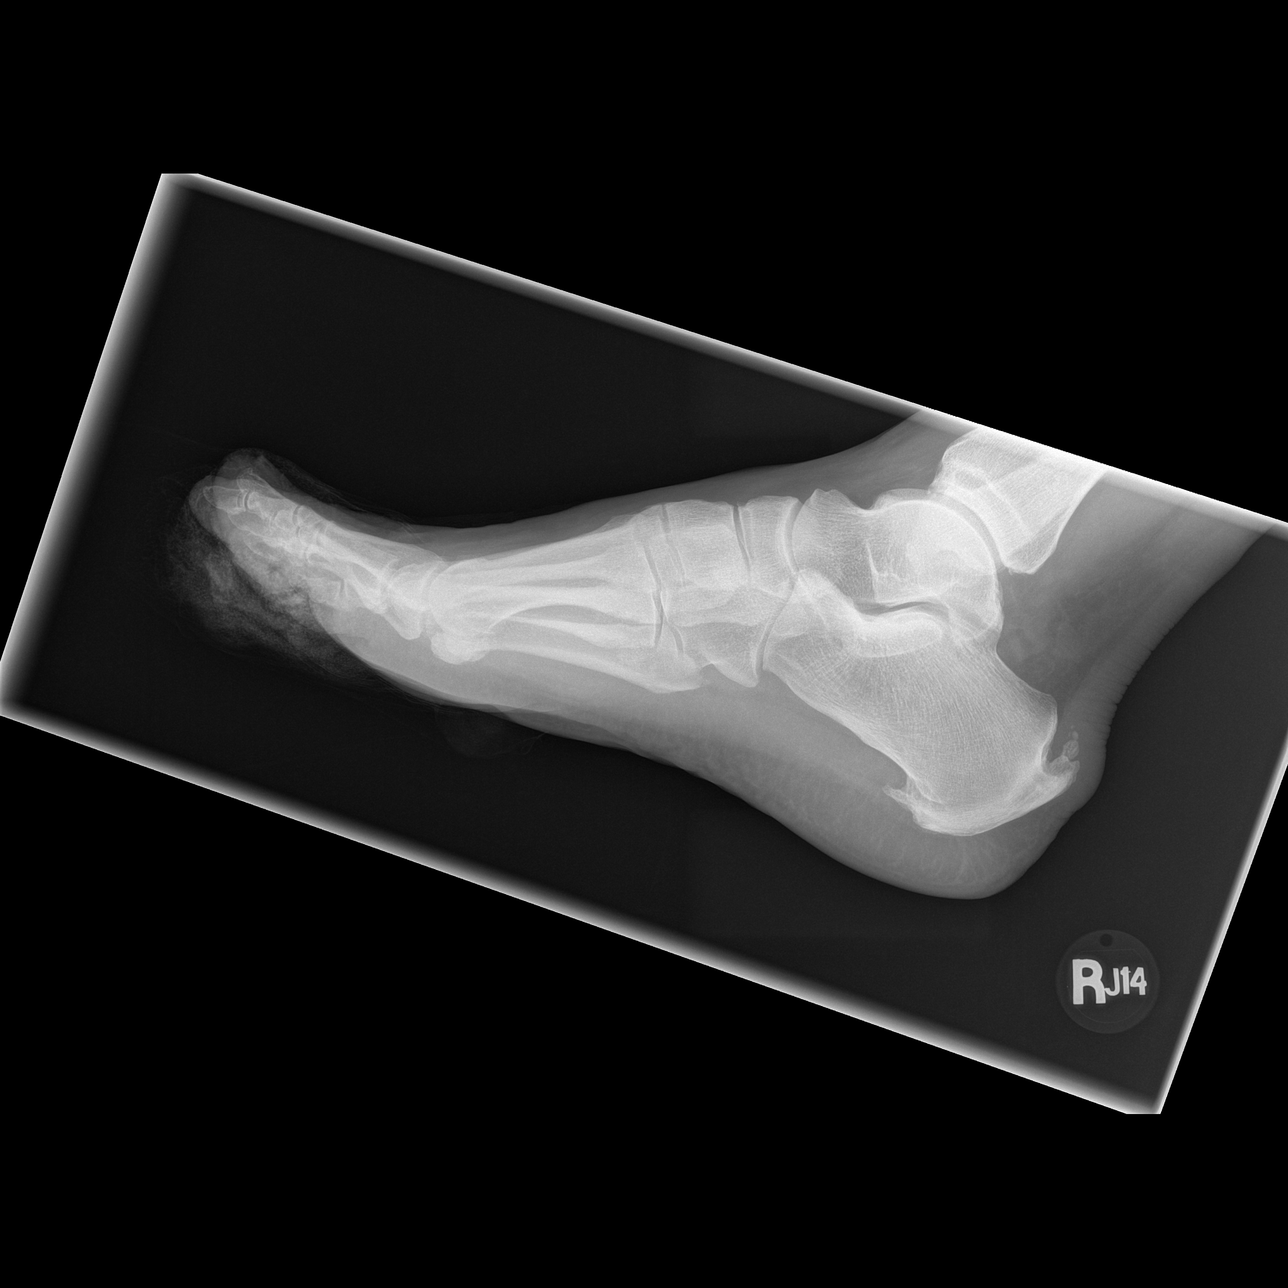

[t foot ap right]
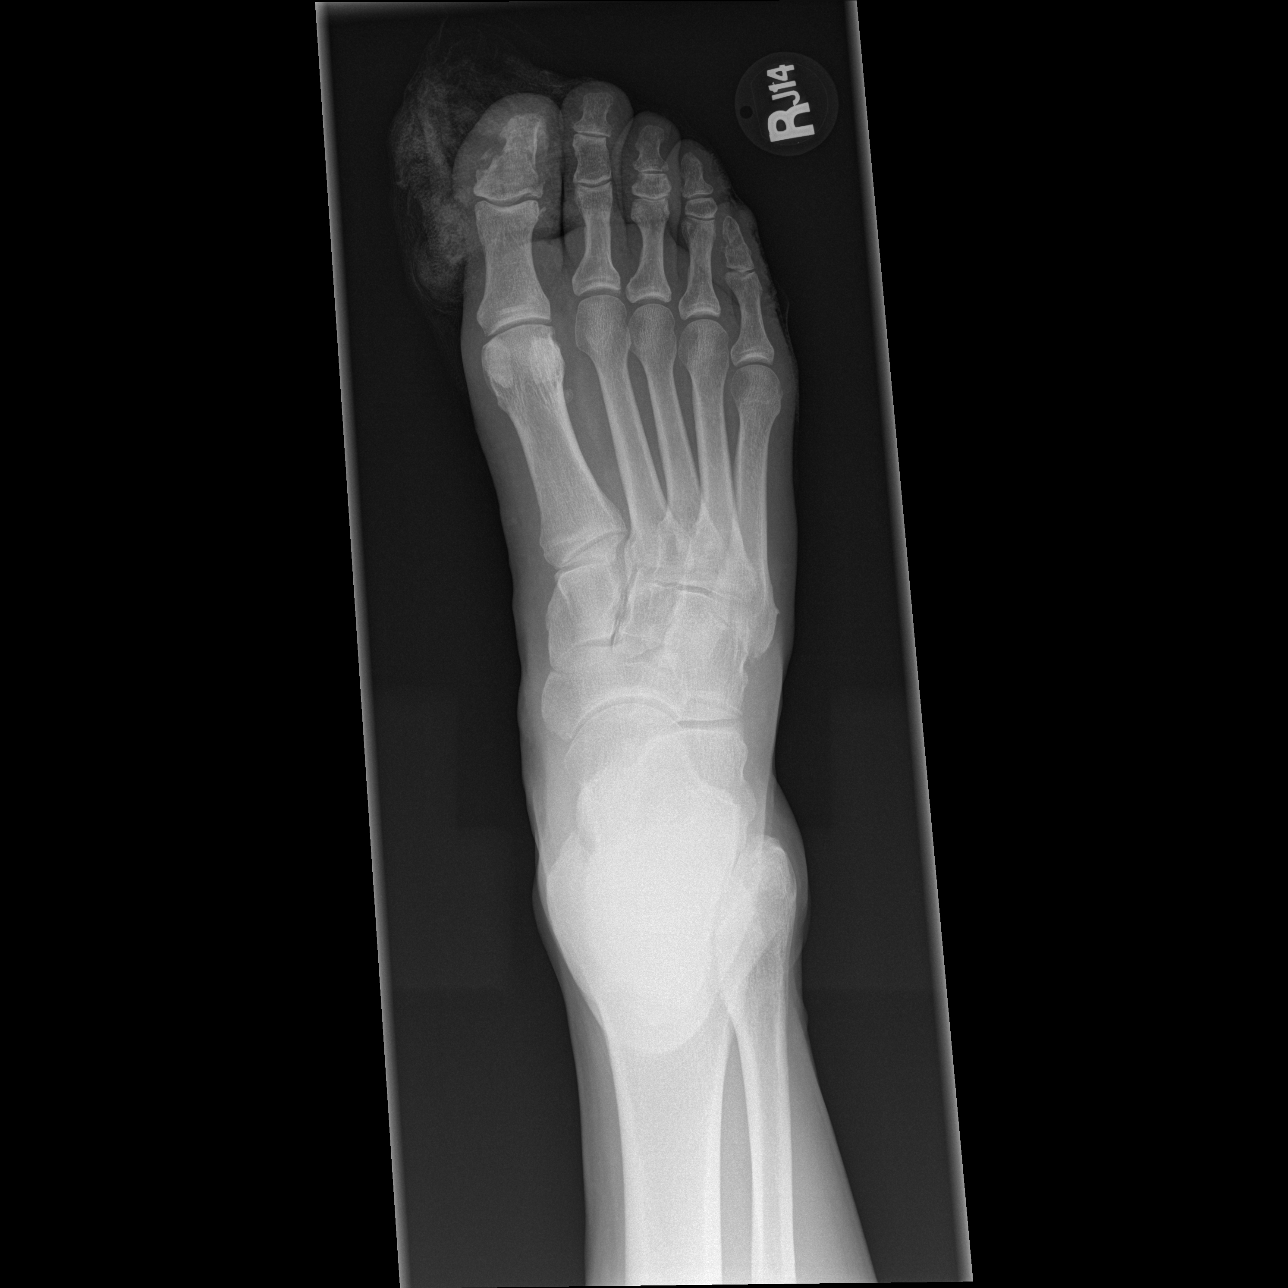

[t foot oblique right]
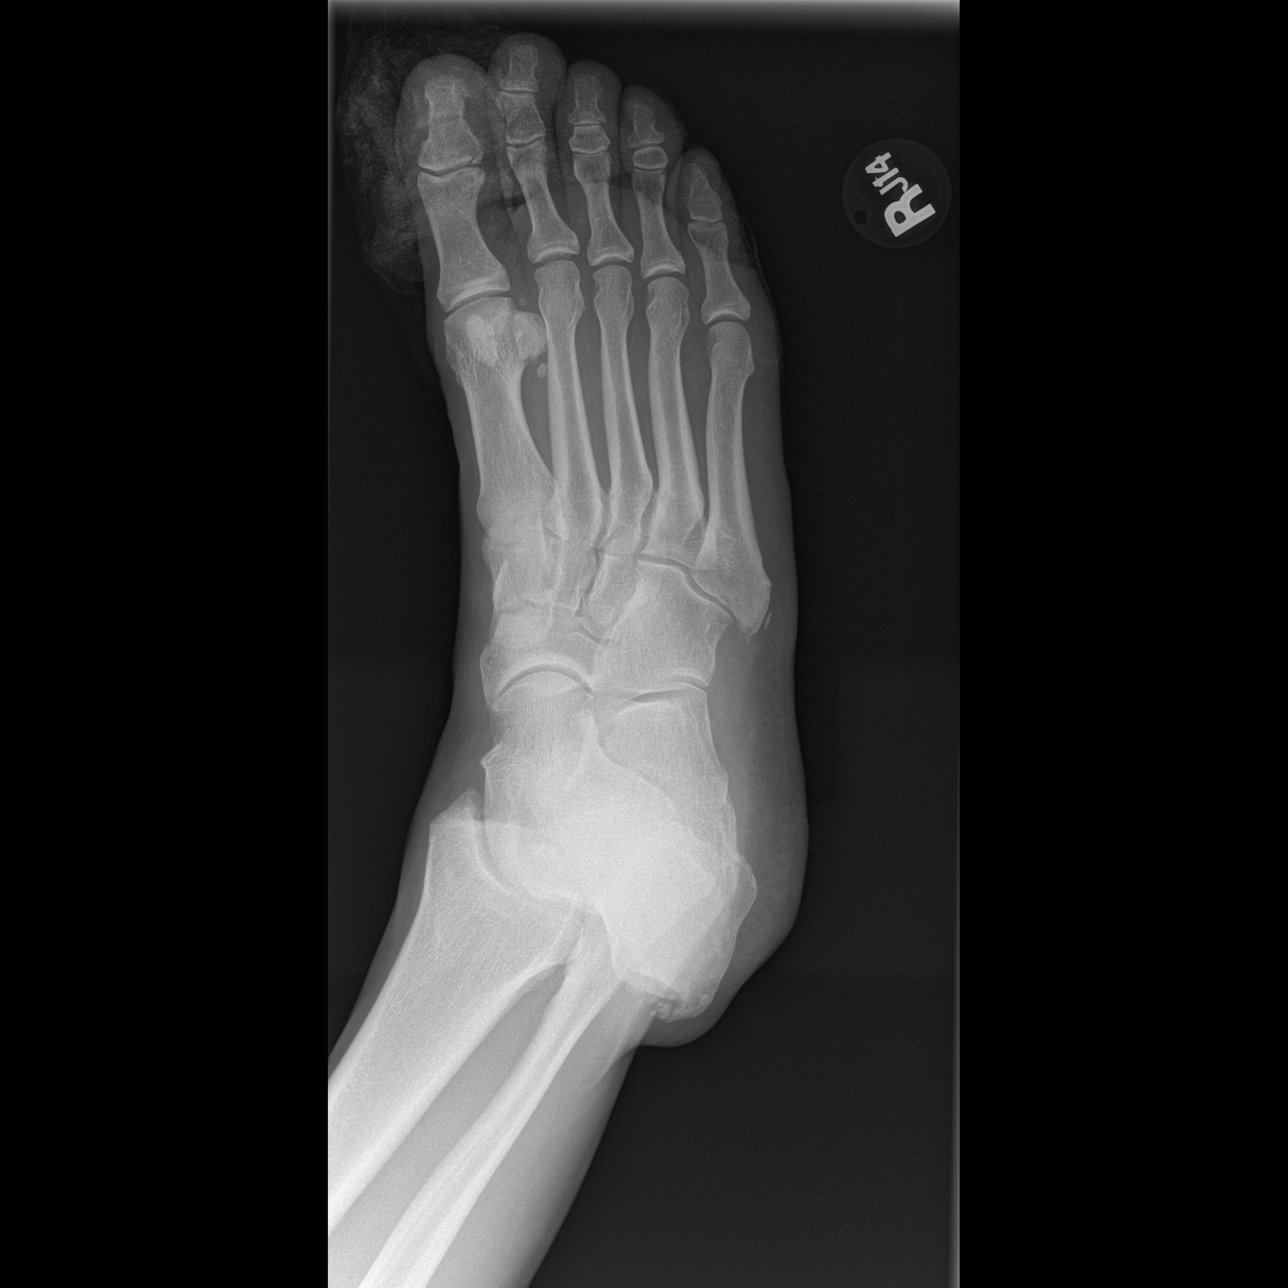

[3 of 3 positions shown; findings below may reference images not displayed]

FINDINGS: Dressing artifacts at great toe.

Osseous mineralization normal.

Joint spaces preserved.

Comminuted nondisplaced fracture distal phalanx RIGHT great toe with
intra-articular extension at the lateral aspect of the IP joint.

No additional fracture, dislocation or bone destruction.

Calcaneal spurring.
IMPRESSION: Comminuted nondisplaced fracture at distal phalanx great toe with
intra-articular extension at the IP joint.

## 2019-12-20 ENCOUNTER — Telehealth: Payer: Self-pay | Admitting: Family

## 2019-12-20 NOTE — Telephone Encounter (Signed)
Called to Discuss with patient about Covid symptoms and the use of the monoclonal antibody infusion for those with mild to moderate Covid symptoms and at a high risk of hospitalization.    Pt appears to qualify for this infusion due to co-morbid conditions and/or a member of an at-risk group in accordance with the FDA Emergency Use Authorization however he is outside of the 10 day window for onset of symptoms with symptom onset of 8/16. Continues to have cough, congestion, and loss of smell/taste. Fist tested positive about 3 weeks ago through home testing and today was negative. Advised to continue with symptom management at this time and follow up with PCP as needed. Hotline number provided.   Marcos Eke, NP
